# Patient Record
Sex: Female | Born: 1960
Health system: Southern US, Community
[De-identification: ages and names within clinical notes are randomized; demographics above are authoritative.]

## PROBLEM LIST (undated history)

## (undated) DIAGNOSIS — I10 Essential (primary) hypertension: Secondary | ICD-10-CM

## (undated) DIAGNOSIS — M1990 Unspecified osteoarthritis, unspecified site: Secondary | ICD-10-CM

## (undated) DIAGNOSIS — R5383 Other fatigue: Secondary | ICD-10-CM

## (undated) DIAGNOSIS — R251 Tremor, unspecified: Secondary | ICD-10-CM

## (undated) DIAGNOSIS — R238 Other skin changes: Secondary | ICD-10-CM

## (undated) DIAGNOSIS — F329 Major depressive disorder, single episode, unspecified: Secondary | ICD-10-CM

## (undated) DIAGNOSIS — IMO0001 Reserved for inherently not codable concepts without codable children: Secondary | ICD-10-CM

## (undated) DIAGNOSIS — R233 Spontaneous ecchymoses: Secondary | ICD-10-CM

## (undated) DIAGNOSIS — R262 Difficulty in walking, not elsewhere classified: Secondary | ICD-10-CM

## (undated) DIAGNOSIS — M549 Dorsalgia, unspecified: Secondary | ICD-10-CM

## (undated) DIAGNOSIS — R450 Nervousness: Secondary | ICD-10-CM

## (undated) DIAGNOSIS — M542 Cervicalgia: Secondary | ICD-10-CM

## (undated) DIAGNOSIS — F419 Anxiety disorder, unspecified: Secondary | ICD-10-CM

## (undated) DIAGNOSIS — F32A Depression, unspecified: Secondary | ICD-10-CM

## (undated) DIAGNOSIS — M2550 Pain in unspecified joint: Secondary | ICD-10-CM

## (undated) HISTORY — DX: Cervicalgia: M54.2

## (undated) HISTORY — DX: Spontaneous ecchymoses: R23.3

## (undated) HISTORY — DX: Depression, unspecified: F32.A

## (undated) HISTORY — DX: Other skin changes: R23.8

## (undated) HISTORY — DX: Dorsalgia, unspecified: M54.9

## (undated) HISTORY — DX: Reserved for inherently not codable concepts without codable children: IMO0001

## (undated) HISTORY — DX: Tremor, unspecified: R25.1

## (undated) HISTORY — DX: Nervousness: R45.0

## (undated) HISTORY — DX: Anxiety disorder, unspecified: F41.9

## (undated) HISTORY — DX: Essential (primary) hypertension: I10

## (undated) HISTORY — DX: Unspecified osteoarthritis, unspecified site: M19.90

## (undated) HISTORY — DX: Major depressive disorder, single episode, unspecified: F32.9

## (undated) HISTORY — PX: OVARIAN CYST SURGERY: SHX726

## (undated) HISTORY — DX: Pain in unspecified joint: M25.50

## (undated) HISTORY — DX: Difficulty in walking, not elsewhere classified: R26.2

## (undated) HISTORY — DX: Other fatigue: R53.83

---

## 1998-05-16 ENCOUNTER — Emergency Department (HOSPITAL_COMMUNITY): Admission: EM | Admit: 1998-05-16 | Discharge: 1998-05-16 | Payer: Self-pay | Admitting: Emergency Medicine

## 1999-07-19 ENCOUNTER — Other Ambulatory Visit: Admission: RE | Admit: 1999-07-19 | Discharge: 1999-07-19 | Payer: Self-pay | Admitting: Obstetrics and Gynecology

## 2005-07-04 ENCOUNTER — Other Ambulatory Visit: Admission: RE | Admit: 2005-07-04 | Discharge: 2005-07-04 | Payer: Self-pay | Admitting: *Deleted

## 2005-09-15 ENCOUNTER — Ambulatory Visit (HOSPITAL_COMMUNITY): Admission: RE | Admit: 2005-09-15 | Discharge: 2005-09-15 | Payer: Self-pay | Admitting: *Deleted

## 2005-09-15 ENCOUNTER — Encounter (INDEPENDENT_AMBULATORY_CARE_PROVIDER_SITE_OTHER): Payer: Self-pay | Admitting: Specialist

## 2009-07-22 ENCOUNTER — Ambulatory Visit (HOSPITAL_COMMUNITY): Admission: RE | Admit: 2009-07-22 | Discharge: 2009-07-22 | Payer: Self-pay | Admitting: Orthopedic Surgery

## 2011-01-27 NOTE — Op Note (Signed)
[name], Randolph               ACCOUNT NO.:  [id number]   MEDICAL RECORD NO.:  [id number]          PATIENT TYPE:  AMB   LOCATION:  SDC                           FACILITY:  [facility]   PHYSICIAN:  Almedia Balls. Fore, M.D.   DATE OF BIRTH:  09-09-1961   DATE OF PROCEDURE:  09/15/2005  DATE OF DISCHARGE:                                 OPERATIVE REPORT   PREOPERATIVE DIAGNOSES:  1.  Abnormal uterine bleeding.  2.  Pelvic pain.  3.  Persisting left ovarian cyst.   POSTOPERATIVE DIAGNOSES:  1.  Abnormal uterine bleeding.  2.  Pelvic pain.  3.  Persisting left ovarian cyst.  4.  Pending pathology.   OPERATION:  1.  Diagnostic hysteroscopy.  2.  Fractional dilatation and curettage.  3.  Laparoscopy with a left ovarian cystectomy.   ANESTHESIA:  General orotracheal.   OPERATOR:  Almedia Balls. Randell Patient, M.D.   INDICATIONS FOR SURGERY:  The patient is a 50 year old with the above-noted  problems.  She has been counseled as to the need for surgery to treat these  problems.  She was fully counseled as to the nature of the procedure and the  risks involved, including risks of anesthesia, injury to uterus, tubes,  ovaries, bowel, bladder, blood vessels, ureters, postoperative  hemorrhage,  infection,  and recuperation.  She fully understands all these  considerations and has signed informed consent to proceed on September 15, 2005.   OPERATIVE FINDINGS:  On hysteroscopy, the uterus was midposterior and  sounded to approximately 8 cm.  The endocervical canal was clean.  The  endometrial cavity had a small amount of shaggy-appearing tissue present.  On laparoscopy, the lower liver edge, gallbladder, spleen and area of the  appendix were normal to visualization.  In the pelvis, the uterus, right  tube and ovary appeared normal.  The left ovary was involved with a cystic  structure approximately 6 cm in greatest dimensions.   PROCEDURE:  With the patient under general anesthesia, prepared and draped  in the usual sterile fashion, a speculum was placed in the vagina.  The  anterior lip of the cervix was grasped with a single-tooth tenaculum, and a  solution of 1% lidocaine was injected at the 3, 9 and 12 o'clock positions  for a total of 10 mL for paracervical block.  A small sharp curette was used  for curettage of the endocervical canal.  A moderate amount of normal-  appearing tissue was obtained plus a polypoid appearing structure as well.  The uterus was sounded as noted above, the cervix dilated up through a#21  [facility] dilator. The diagnostic hysteroscope was introduced using free flow of  [place] and direct vision with the above-noted findings.  The hysteroscope  was removed, and a medium sharp curette and polyp forceps were used for  removal of tissue from the endometrial cavity.  When no further tissue was  obtained, the hysteroscope was re-employed to ensure that all tissue had  been removed and that hemostasis was maintained and that sponge and  instrument counts were correct.  When this  was noted to be the case, an  acorn cannula was placed on the cervix, and the patient was catheterized  with free flow of clear urine.   She was prepared and draped for a laparoscopic procedure.  An incision was  made in the lower pole of the umbilicus with insertion of the Veress cannula  and insufflation of 3 L of carbon dioxide.  The reusable 10 mm trocar for  the operative scope and the scope itself were then inserted into the  peritoneal cavity.  A reusable 5 mm probe was inserted through a stab wound  just above the symphysis pubis but well above the reflection of the bladder.  The above-noted findings were then visualized.  The left ovarian cyst wall  was rendered hemostatic for insertion of a suction aspirator to remove the  fluid, which was totally clear.  The cyst wall was rendered hemostatic with  bipolar electrocoagulation.  Sharp dissection was used to remove the cyst  from the  remaining normal-appearing ovarian tissue.  This was accomplished  without difficulty.  Incised edges were rendered hemostatic with bipolar  electrocoagulation.  The area was then lavaged with copious amounts of  lactated Ringer's solution, ad after noting hemostasis was maintained  following reduction of intra-abdominal pressure by allowing gas to escape  and that sponge and instrument counts were correct, the instruments were  removed from the peritoneal cavity.  Gas was allowed to fully escape, and  the incisions were closed with fascial sutures of 3-0 Vicryl and  subcuticular sutures of 3-0 plain catgut.  Estimated blood loss less than 25  mL.  The patient was taken to the recovery room in good condition following  catheterization and free flow of clear urine.   FOLLOW-UP CARE:  She is to return to the office in two weeks for follow-up  and was instructed to call if heavy bleeding, pain or unexplained fever  should ensue.  She was given prescriptions for Darvocet N-100, #20, to be  taken one-half to two q.6h. p.r.n. pain and Macrobid generic, #8, to be  taken two stat, one b.i.d.           ______________________________  Almedia Balls. Randell Patient, M.D.     SRF/MEDQ  D:  09/15/2005  T:  09/15/2005  Job:  [id number]

## 2012-06-19 ENCOUNTER — Other Ambulatory Visit (HOSPITAL_COMMUNITY): Payer: Self-pay | Admitting: Orthopedic Surgery

## 2012-06-19 DIAGNOSIS — M542 Cervicalgia: Secondary | ICD-10-CM

## 2012-06-21 ENCOUNTER — Ambulatory Visit (HOSPITAL_COMMUNITY)
Admission: RE | Admit: 2012-06-21 | Discharge: 2012-06-21 | Disposition: A | Discharge status: Home / Self Care | Payer: 59 | Source: Ambulatory Visit | Attending: Orthopedic Surgery | Admitting: Orthopedic Surgery

## 2012-06-21 DIAGNOSIS — M542 Cervicalgia: Secondary | ICD-10-CM

## 2012-06-21 DIAGNOSIS — M5020 Other cervical disc displacement, unspecified cervical region: Secondary | ICD-10-CM | POA: Insufficient documentation

## 2013-08-01 ENCOUNTER — Encounter: Payer: Self-pay | Admitting: Podiatry

## 2013-08-01 ENCOUNTER — Ambulatory Visit (INDEPENDENT_AMBULATORY_CARE_PROVIDER_SITE_OTHER): Payer: 59 | Admitting: Podiatry

## 2013-08-01 ENCOUNTER — Ambulatory Visit (INDEPENDENT_AMBULATORY_CARE_PROVIDER_SITE_OTHER): Payer: 59

## 2013-08-01 ENCOUNTER — Ambulatory Visit: Payer: Self-pay | Admitting: Podiatry

## 2013-08-01 VITALS — BP 90/75 | HR 68 | Resp 16 | Ht 64.0 in | Wt 210.0 lb

## 2013-08-01 DIAGNOSIS — B351 Tinea unguium: Secondary | ICD-10-CM

## 2013-08-01 DIAGNOSIS — M79609 Pain in unspecified limb: Secondary | ICD-10-CM

## 2013-08-01 DIAGNOSIS — L259 Unspecified contact dermatitis, unspecified cause: Secondary | ICD-10-CM

## 2013-08-01 DIAGNOSIS — M79671 Pain in right foot: Secondary | ICD-10-CM

## 2013-08-01 DIAGNOSIS — M779 Enthesopathy, unspecified: Secondary | ICD-10-CM

## 2013-08-01 MED ORDER — TRIAMCINOLONE ACETONIDE 10 MG/ML IJ SUSP
5.0000 mg | Freq: Once | INTRAMUSCULAR | Status: AC
Start: 2013-08-01 — End: 2013-08-01
  Administered 2013-08-01: 5 mg via INTRA_ARTICULAR

## 2013-08-01 NOTE — Progress Notes (Signed)
  Subjective:    Patient ID: Melody Randolph, female    DOB: [birth year], 52 y.o.   MRN: [id number]  HPI Comments: N PAIN L RIGHT FOOT/ANKLE D 6 WEEKS O SUDDEN C BETTER A STANDING, WALKING T ICE, ELEVATE, ALEVE, IBUPROFEN, ACE WRAP, BRACE   Foot Pain Associated symptoms include fatigue.      Review of Systems  Constitutional: Positive for fatigue.       WEIGHT CHANGE SWEATING   HENT: Negative.   Eyes: Negative.   Respiratory: Negative.   Cardiovascular: Negative.   Gastrointestinal: Negative.   Endocrine: Negative.   Genitourinary: Negative.   Musculoskeletal: Positive for back pain.       JOINT PAIN DIFFICULTY WALKING  Skin: Negative.   Allergic/Immunologic: Negative.   Neurological: Positive for tremors.  Hematological: Bruises/bleeds easily.  Psychiatric/Behavioral:       NERVOUS       Objective:   Physical Exam        Assessment & Plan:

## 2013-08-01 NOTE — Progress Notes (Signed)
Subjective:     Patient ID: Melody Randolph, female   DOB: 25-Jun-1961, 52 y.o.   MRN: [id number]  Foot Pain   patient presents stating my right ankle has been hurting me for about 6 weeks since I changed shoe gear try to reduce activity and use ice without relief of symptoms   Review of Systems  All other systems reviewed and are negative.       Objective:   Physical Exam  Nursing note and vitals reviewed. Constitutional: She is oriented to person, place, and time.  Cardiovascular: Intact distal pulses.   Musculoskeletal: Normal range of motion.  Neurological: She is oriented to person, place, and time.  Skin: Skin is warm.   neurovascular status intact with discomfort in the sinus tarsi right and slightly into the lateral foot but nowhere near as intense as the ankle itself muscle strength adequate with no equinus condition noted     Assessment:     Sinus tarsitis right with inflammation of the capsule    Plan:     H&P and x-ray reviewed of left foot ankle. Injected the right sinus tarsi 3 movements Kenalog 5 mg Xylocaine Marcaine mixture and advised on ankle brace or possible boot immobilization if symptoms do not get better over the next couple weeks. Reappoint as needed

## 2015-09-23 DIAGNOSIS — F321 Major depressive disorder, single episode, moderate: Secondary | ICD-10-CM | POA: Diagnosis not present

## 2015-10-15 MED FILL — ESCITALOPRAM 10 MG TABLET: 10 | 30 days supply | Qty: 30 | Fill #0

## 2015-10-20 DIAGNOSIS — C44319 Basal cell carcinoma of skin of other parts of face: Secondary | ICD-10-CM | POA: Diagnosis not present

## 2015-10-20 DIAGNOSIS — D225 Melanocytic nevi of trunk: Secondary | ICD-10-CM | POA: Diagnosis not present

## 2015-10-20 DIAGNOSIS — C44519 Basal cell carcinoma of skin of other part of trunk: Secondary | ICD-10-CM | POA: Diagnosis not present

## 2015-10-20 DIAGNOSIS — D1801 Hemangioma of skin and subcutaneous tissue: Secondary | ICD-10-CM | POA: Diagnosis not present

## 2015-10-20 DIAGNOSIS — C44310 Basal cell carcinoma of skin of unspecified parts of face: Secondary | ICD-10-CM | POA: Diagnosis not present

## 2015-10-20 DIAGNOSIS — L821 Other seborrheic keratosis: Secondary | ICD-10-CM | POA: Diagnosis not present

## 2015-11-17 DIAGNOSIS — Z85828 Personal history of other malignant neoplasm of skin: Secondary | ICD-10-CM | POA: Diagnosis not present

## 2015-11-17 DIAGNOSIS — Z08 Encounter for follow-up examination after completed treatment for malignant neoplasm: Secondary | ICD-10-CM | POA: Diagnosis not present

## 2015-11-22 MED FILL — ESCITALOPRAM 10 MG TABLET: 10 | 30 days supply | Qty: 30 | Fill #1

## 2015-12-31 MED FILL — ESCITALOPRAM 10 MG TABLET: 10 | 30 days supply | Qty: 30 | Fill #1 | Status: TO

## 2016-01-07 ENCOUNTER — Ambulatory Visit (HOSPITAL_COMMUNITY)
Admission: EM | Admit: 2016-01-07 | Discharge: 2016-01-07 | Disposition: A | Discharge status: Home / Self Care | Payer: 59 | Attending: Family Medicine | Admitting: Family Medicine

## 2016-01-07 ENCOUNTER — Encounter (HOSPITAL_COMMUNITY): Payer: Self-pay

## 2016-01-07 DIAGNOSIS — J302 Other seasonal allergic rhinitis: Secondary | ICD-10-CM | POA: Diagnosis not present

## 2016-01-07 DIAGNOSIS — R058 Other specified cough: Secondary | ICD-10-CM

## 2016-01-07 DIAGNOSIS — R05 Cough: Secondary | ICD-10-CM

## 2016-01-07 MED ORDER — GUAIFENESIN ER 600 MG PO TB12: 1200.0000 mg | ORAL_TABLET | Freq: Two times a day (BID) | ORAL | Status: DC

## 2016-01-07 MED ORDER — PREDNISONE 10 MG PO TABS: 40.0000 mg | ORAL_TABLET | Freq: Every day | ORAL | Status: DC

## 2016-01-07 MED ORDER — CETIRIZINE HCL 10 MG PO TABS: 10.0000 mg | ORAL_TABLET | Freq: Every day | ORAL | Status: AC

## 2016-01-07 MED ORDER — ALBUTEROL SULFATE HFA 108 (90 BASE) MCG/ACT IN AERS: 2.0000 | INHALATION_SPRAY | RESPIRATORY_TRACT | Status: DC | PRN

## 2016-01-07 NOTE — ED Notes (Signed)
Patient states she may have bronchitis she has fluid in her chest possible URI No acute distress

## 2016-01-07 NOTE — Discharge Instructions (Signed)
It is a pleasure to see you today.  I believe there is a significant allergy component to your chest congestion and cough.   I recommend CETIRIZINE 10mg  tablets, take 1 tablet by mouth once daily in the morning.   For the chest congestion, MUCINEX 600mg  tablets, take 2 tablets by mouth every 12 hours as needed to break up mucus.    You may use ALBUTEROL inhaler, 2 puffs every 4-6 hours as needed.   If you are not improving with this regimen, I believe it is reasonable to fill the prescription for PREDNISONE 20mg , take 2 tablets (40mg ) by mouth once daily for 5 days.    Follow up with your primary doctor in the [place] or return to [facility] if not improved, if new problems arise.

## 2016-01-07 NOTE — ED Provider Notes (Signed)
CSN: [id number]     Arrival date & time 01/07/16  1541 History   First MD Initiated Contact with Patient 01/07/16 1800     Chief Complaint  Patient presents with  . Cough   (Consider location/radiation/quality/duration/timing/severity/associated sxs/prior Treatment) Patient is a 55 y.o. female presenting with cough. The history is provided by the patient. No language interpreter was used.  Cough Associated symptoms: eye discharge, fever and rhinorrhea   Associated symptoms: no chest pain, no chills, no diaphoresis, no shortness of breath, no sore throat and no wheezing   Patient presents with complaint of chest congestion and cough productive of white/clear sputum since April 23/24th.  She mowed the grass on 04/22 and soon thereafter had worsening of cough and congestion. Suffers with springtime seasonal allergies, does not take any medicines for this.  Predominant sxs are nasal (rhinorrhea) and ocular (tearing, coryza).   Has been using lemon and honey, to try and break up phlegm.   No measured fevers, did feel "warm" a couple times during the week. No chills or sweats.   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   Social Hx; Works as Teaching laboratory technician.  Former smoker quit over 20 years ago.      Past Medical History  Diagnosis Date  . Sweating   . Fatigue   . Joint pain   . Back pain   . Difficulty walking   . Tremors of nervous system   . Bruises easily   . Nervous   . Anxiety   . Depression   . HBP (high blood pressure)   . Arthritis   . Neck pain    Past Surgical History  Procedure Laterality Date  . Ovarian cyst surgery     No family history on file. Social History  Substance Use Topics  . Smoking status: Current Some Day Smoker    Types: Cigarettes  . Smokeless tobacco: Never Used  . Alcohol Use: Yes     Comment: WEEKENDS   OB History    No data available     Review of Systems  Constitutional: Positive for fever. Negative for chills, diaphoresis, appetite  change and fatigue.  HENT: Positive for congestion, postnasal drip and rhinorrhea. Negative for ear discharge, sinus pressure, sore throat and trouble swallowing.   Eyes: Positive for discharge and itching. Negative for photophobia and pain.  Respiratory: Positive for cough. Negative for choking, shortness of breath, wheezing and stridor.   Cardiovascular: Negative for chest pain and palpitations.  Gastrointestinal: Negative for nausea, vomiting, abdominal pain and diarrhea.    Allergies  Review of patient's allergies indicates no known allergies.  Home Medications   Prior to Admission medications   Medication Sig Start Date End Date Taking? Authorizing Provider  albuterol (PROVENTIL HFA;VENTOLIN HFA) 108 (90 Base) MCG/ACT inhaler Inhale 2 puffs into the lungs every 4 (four) hours as needed for wheezing or shortness of breath. 01/07/16   Willeen Niece, MD  cetirizine (ZYRTEC) 10 MG tablet Take 1 tablet (10 mg total) by mouth daily. 01/07/16   Willeen Niece, MD  guaiFENesin (MUCINEX) 600 MG 12 hr tablet Take 2 tablets (1,200 mg total) by mouth 2 (two) times daily. 01/07/16   Willeen Niece, MD  predniSONE (DELTASONE) 10 MG tablet Take 4 tablets (40 mg total) by mouth daily with breakfast. 01/07/16   Willeen Niece, MD  ValACYclovir HCl (VALTREX PO) Take 500 mg by mouth as needed.    Historical Provider, MD   Meds Ordered  and Administered this Visit  Medications - No data to display  BP 113/75 mmHg  Pulse 63  Temp(Src) 98.4 F (36.9 C) (Oral)  Resp 16  SpO2 100% No data found.   Physical Exam  Constitutional: She appears well-developed and well-nourished. No distress.  HENT:  Head: Normocephalic and atraumatic.  Right Ear: External ear normal.  Left Ear: External ear normal.  Mouth/Throat: Oropharynx is clear and moist. No oropharyngeal exudate.  Boggy nasal mucosa with clear rhinorrhea  Eyes: Conjunctivae and EOM are normal. Pupils are equal, round, and reactive to light. No scleral  icterus.  Mild injected sclerae with tearing bilaterally  Neck: Normal range of motion. Neck supple. No thyromegaly present.  Cardiovascular: Normal rate, regular rhythm and normal heart sounds.   Pulmonary/Chest: Effort normal. No respiratory distress. She has no wheezes. She has no rales. She exhibits no tenderness.  Scattered rhonchi bilaterally on lung exam. No rales or wheezes noted. Good air movement noted.   Lymphadenopathy:    She has no cervical adenopathy.  Skin: She is not diaphoretic.    ED Course  Procedures (including critical care time)  Labs Review Labs Reviewed - No data to display  Imaging Review No results found.   Visual Acuity Review  Right Eye Distance:   Left Eye Distance:   Bilateral Distance:    Right Eye Near:   Left Eye Near:    Bilateral Near:         MDM   1. Other seasonal allergic rhinitis   2. Allergic cough    Patient with coughing and chest congestion likely secondary to allergic etiology.  Also possible viral component given large number sick contacts at work.   Daily Zyrtec 10mg  daily.  May use Albuterol prn for cough.  Twice-daily mucolytic and increased fluids.   If not improving (or worsening) with this regimen, may use short-burst oral prednisone, discussed with patient.  For follow up in [facility] or [place] primary doctor as needed if not improving.  Dalbert Mayotte, MD    Willeen Niece, MD 01/07/16 [phone]

## 2016-01-14 MED FILL — VENTOLIN HFA 90 MCG INHALER: 108 (90 BAS | 16 days supply | Qty: 18 | Fill #0

## 2016-01-14 MED FILL — predniSONE 10 MG TABS: 10 | 3 days supply | Qty: 10 | Fill #0

## 2016-01-27 ENCOUNTER — Other Ambulatory Visit: Payer: Self-pay | Admitting: Occupational Medicine

## 2016-01-27 ENCOUNTER — Ambulatory Visit: Payer: Self-pay

## 2016-01-27 DIAGNOSIS — M549 Dorsalgia, unspecified: Secondary | ICD-10-CM

## 2016-01-27 DIAGNOSIS — M25561 Pain in right knee: Secondary | ICD-10-CM

## 2016-02-03 MED FILL — ESCITALOPRAM 10 MG TABLET: 10 | 30 days supply | Qty: 30 | Fill #0

## 2016-04-17 MED FILL — ESCITALOPRAM 10 MG TABLET: 10 | 30 days supply | Qty: 30 | Fill #1

## 2016-05-23 MED FILL — ESCITALOPRAM 10 MG TABLET: 10 | 30 days supply | Qty: 30 | Fill #2

## 2016-06-01 DIAGNOSIS — Z1211 Encounter for screening for malignant neoplasm of colon: Secondary | ICD-10-CM | POA: Diagnosis not present

## 2016-06-01 DIAGNOSIS — Z809 Family history of malignant neoplasm, unspecified: Secondary | ICD-10-CM | POA: Diagnosis not present

## 2016-07-07 MED FILL — ESCITALOPRAM 10 MG TABLET: 10 | 30 days supply | Qty: 30 | Fill #3

## 2016-07-12 DIAGNOSIS — Z01818 Encounter for other preprocedural examination: Secondary | ICD-10-CM | POA: Diagnosis not present

## 2016-07-24 DIAGNOSIS — H02834 Dermatochalasis of left upper eyelid: Secondary | ICD-10-CM | POA: Diagnosis not present

## 2016-07-24 DIAGNOSIS — H53483 Generalized contraction of visual field, bilateral: Secondary | ICD-10-CM | POA: Diagnosis not present

## 2016-07-24 DIAGNOSIS — H02831 Dermatochalasis of right upper eyelid: Secondary | ICD-10-CM | POA: Diagnosis not present

## 2016-07-24 DIAGNOSIS — H02413 Mechanical ptosis of bilateral eyelids: Secondary | ICD-10-CM | POA: Diagnosis not present

## 2016-10-10 DIAGNOSIS — Z791 Long term (current) use of non-steroidal anti-inflammatories (NSAID): Secondary | ICD-10-CM | POA: Diagnosis not present

## 2016-10-10 DIAGNOSIS — Z1211 Encounter for screening for malignant neoplasm of colon: Secondary | ICD-10-CM | POA: Diagnosis not present

## 2016-10-10 DIAGNOSIS — K529 Noninfective gastroenteritis and colitis, unspecified: Secondary | ICD-10-CM | POA: Diagnosis not present

## 2016-10-10 DIAGNOSIS — K5730 Diverticulosis of large intestine without perforation or abscess without bleeding: Secondary | ICD-10-CM | POA: Diagnosis not present

## 2017-01-31 DIAGNOSIS — S80911A Unspecified superficial injury of right knee, initial encounter: Secondary | ICD-10-CM | POA: Diagnosis not present

## 2017-01-31 DIAGNOSIS — W1849XA Other slipping, tripping and stumbling without falling, initial encounter: Secondary | ICD-10-CM | POA: Diagnosis not present

## 2017-01-31 DIAGNOSIS — I10 Essential (primary) hypertension: Secondary | ICD-10-CM | POA: Diagnosis not present

## 2017-01-31 DIAGNOSIS — W010XXA Fall on same level from slipping, tripping and stumbling without subsequent striking against object, initial encounter: Secondary | ICD-10-CM | POA: Diagnosis not present

## 2017-01-31 DIAGNOSIS — S81011A Laceration without foreign body, right knee, initial encounter: Secondary | ICD-10-CM | POA: Diagnosis not present

## 2017-01-31 DIAGNOSIS — S8991XA Unspecified injury of right lower leg, initial encounter: Secondary | ICD-10-CM | POA: Diagnosis not present

## 2017-01-31 DIAGNOSIS — M25561 Pain in right knee: Secondary | ICD-10-CM | POA: Diagnosis not present

## 2017-02-01 IMAGING — CR DG LUMBAR SPINE COMPLETE 4+V
5 series · 5 of 5 positions shown · non-contrast
Comparison: No prior .

CLINICAL DATA: Injury.  Pain with radiation down right leg.

EXAM:
LUMBAR SPINE - COMPLETE 4+ VIEW

[view not recorded (1 of 5)]
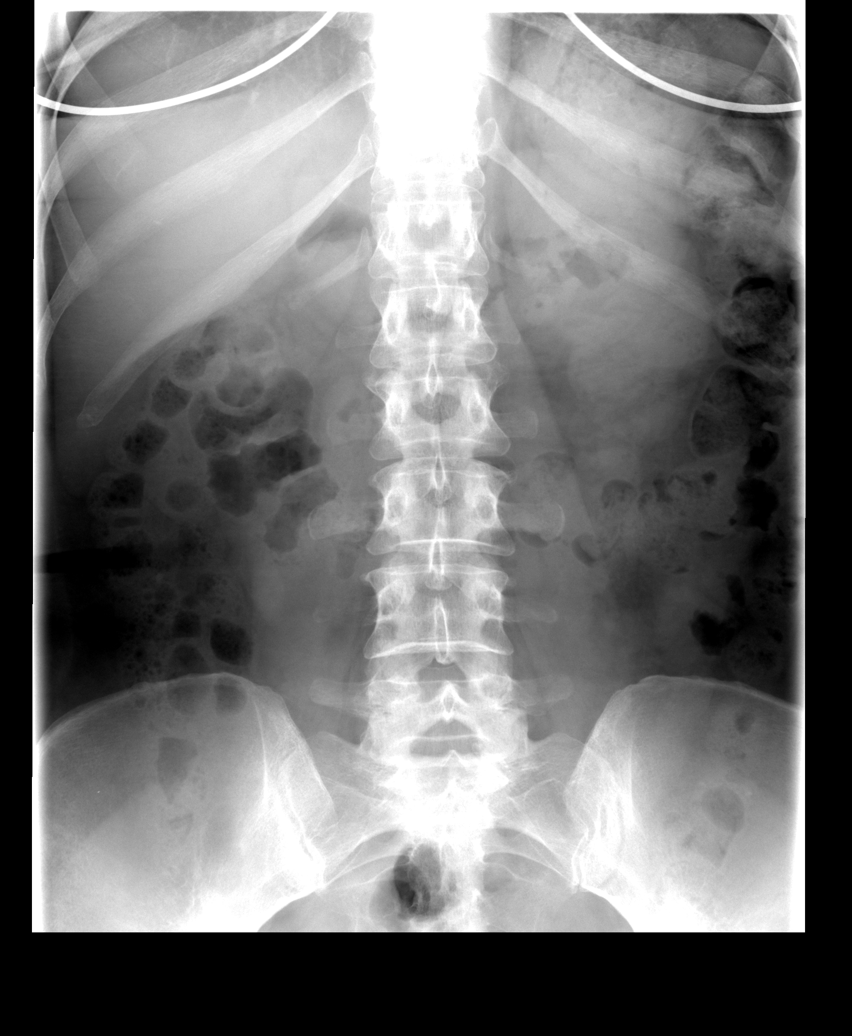

[view not recorded (2 of 5)]
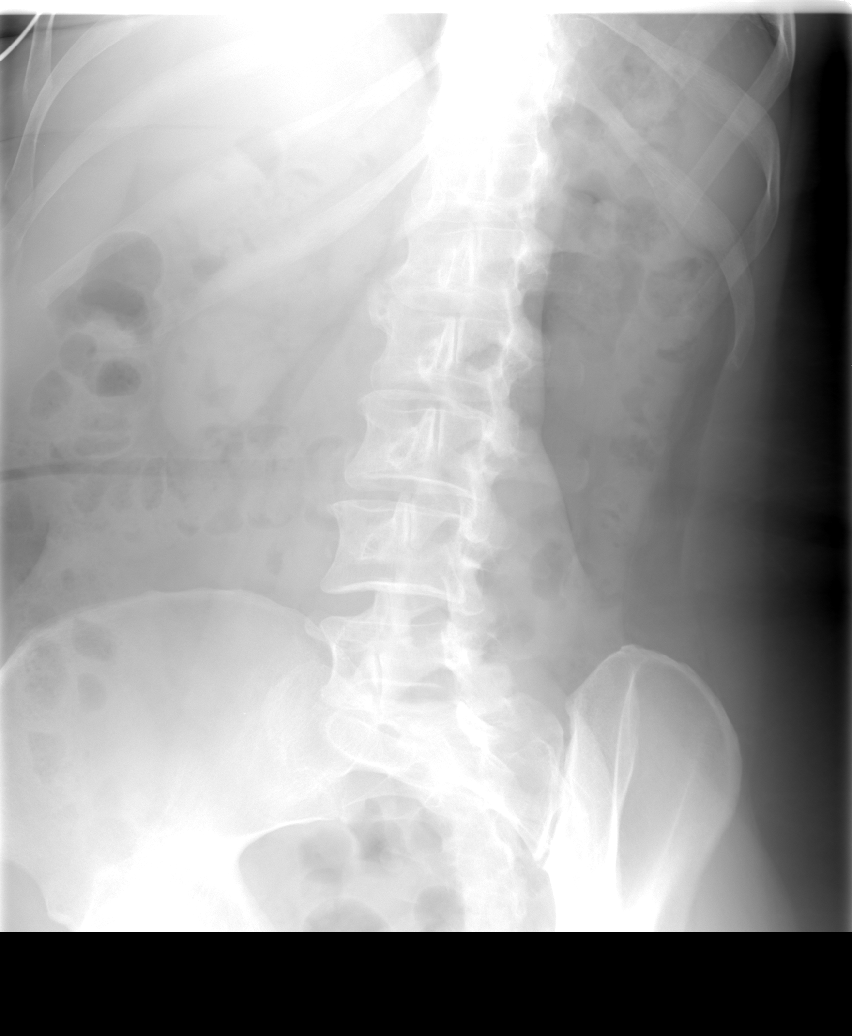

[view not recorded (3 of 5)]
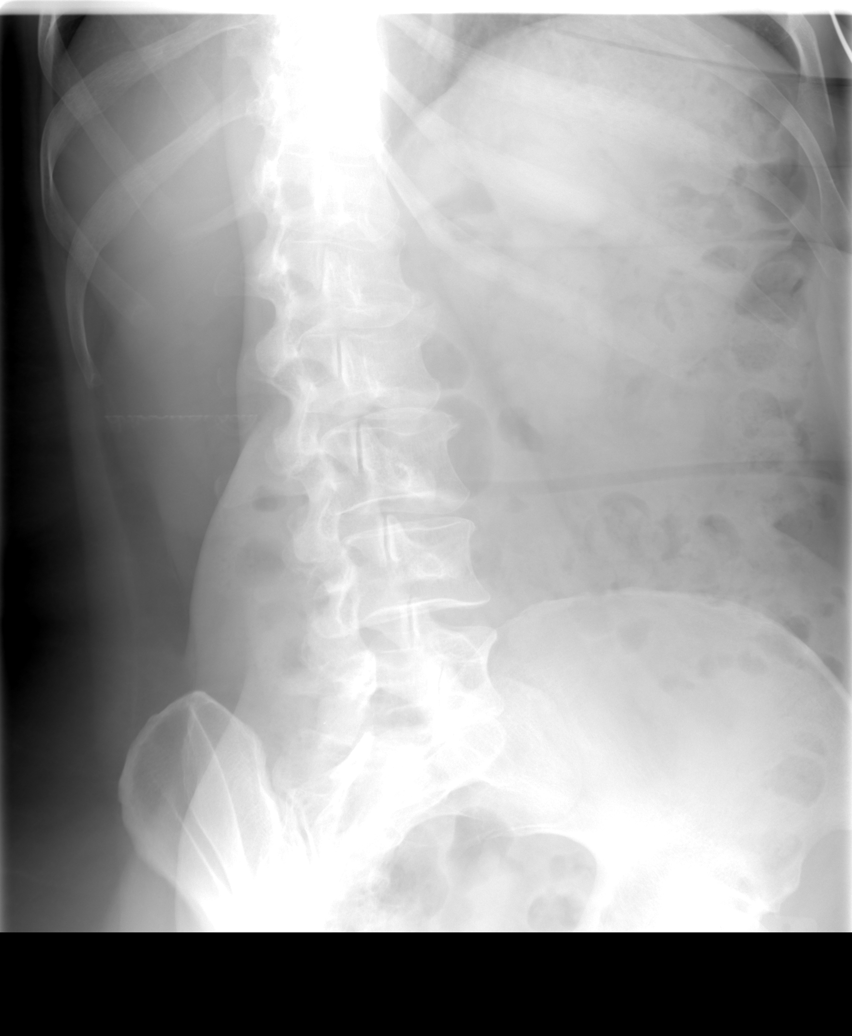

[view not recorded (4 of 5)]
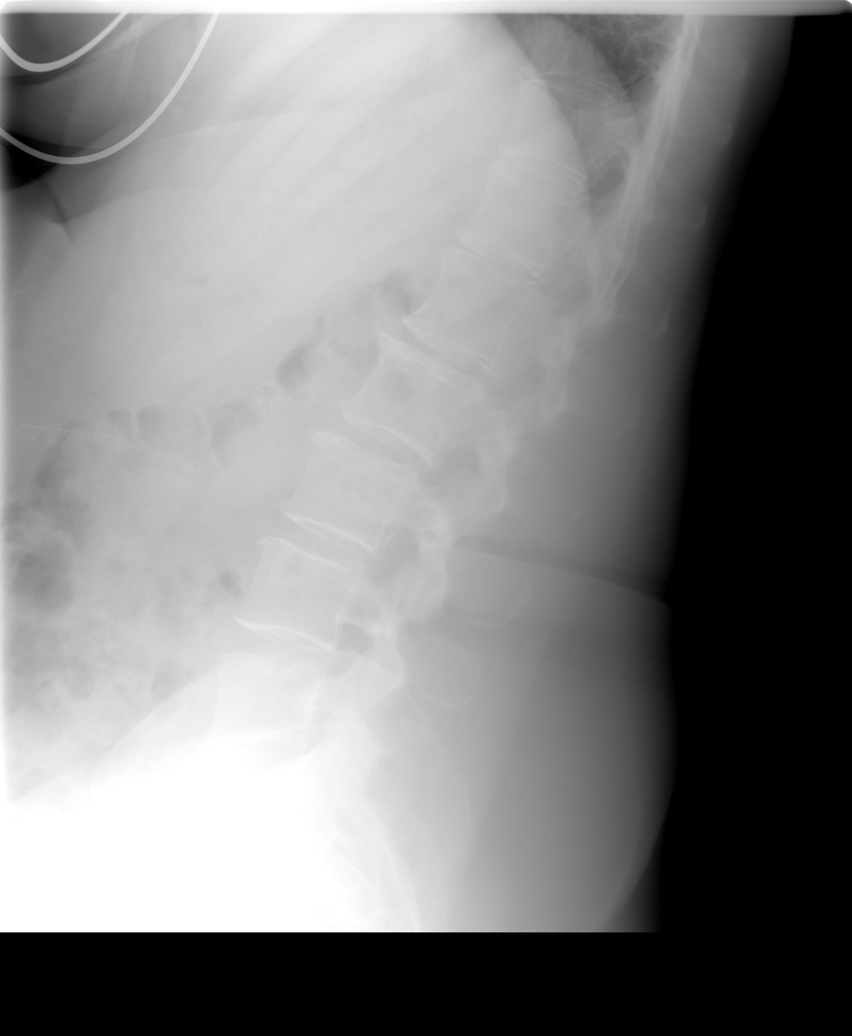

[view not recorded (5 of 5)]
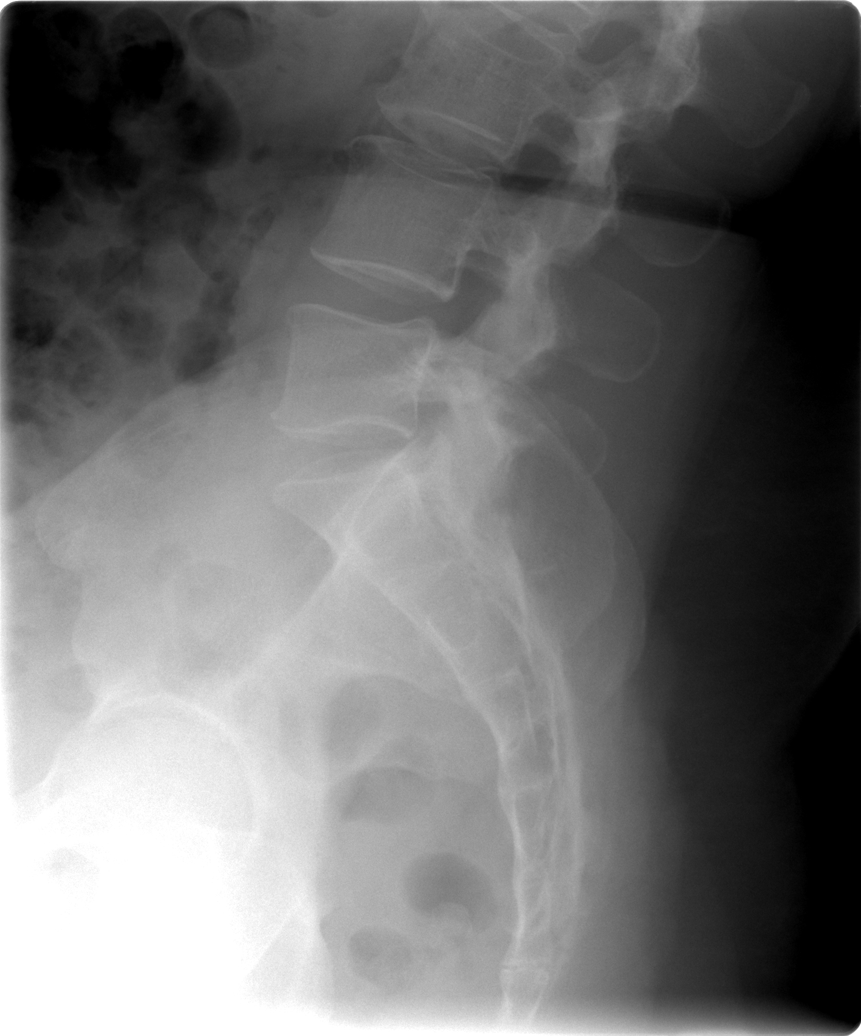

[5 of 5 positions shown; findings below may reference images not displayed]

FINDINGS: No acute bony abnormality identified. Normal alignment and
mineralization.
IMPRESSION: No acute or focal abnormality.

## 2017-02-06 ENCOUNTER — Encounter (HOSPITAL_COMMUNITY): Payer: Self-pay | Admitting: Family Medicine

## 2017-02-06 ENCOUNTER — Ambulatory Visit (HOSPITAL_COMMUNITY)
Admission: EM | Admit: 2017-02-06 | Discharge: 2017-02-06 | Disposition: A | Discharge status: Home / Self Care | Payer: 59 | Attending: Family Medicine | Admitting: Family Medicine

## 2017-02-06 DIAGNOSIS — S81011A Laceration without foreign body, right knee, initial encounter: Secondary | ICD-10-CM | POA: Diagnosis not present

## 2017-02-06 DIAGNOSIS — R1030 Lower abdominal pain, unspecified: Secondary | ICD-10-CM

## 2017-02-06 NOTE — Discharge Instructions (Signed)
I don't see a problem with you're going back to work today.  He should come back in a week for suture removal at which time he should not have to pay because suture removal is a follow-up visit.

## 2017-02-06 NOTE — ED Provider Notes (Signed)
[facility]    CSN: [id number] Arrival date & time: 02/06/17  1001     History   Chief Complaint Chief Complaint  Patient presents with  . Follow-up    HPI Melody Randolph is a 56 y.o. female.   This a 56 year old woman comes into the [facility] urgent care center for evaluation of a knee injury. She cut her right knee in [place] on 01/31/2017 and had a 2 layer closure. She is able to bend the knee fine now although she does wear a brace. She comes in asking if she can go back to work.      Past Medical History:  Diagnosis Date  . Anxiety   . Arthritis   . Back pain   . Bruises easily   . Depression   . Difficulty walking   . Fatigue   . HBP (high blood pressure)   . Joint pain   . Neck pain   . Nervous   . Sweating   . Tremors of nervous system     There are no active problems to display for this patient.   Past Surgical History:  Procedure Laterality Date  . OVARIAN CYST SURGERY      OB History    No data available       Home Medications    Prior to Admission medications   Medication Sig Start Date End Date Taking? Authorizing Provider  albuterol (PROVENTIL HFA;VENTOLIN HFA) 108 (90 Base) MCG/ACT inhaler Inhale 2 puffs into the lungs every 4 (four) hours as needed for wheezing or shortness of breath. 01/07/16   Willeen Niece, MD  cetirizine (ZYRTEC) 10 MG tablet Take 1 tablet (10 mg total) by mouth daily. 01/07/16   Willeen Niece, MD  guaiFENesin (MUCINEX) 600 MG 12 hr tablet Take 2 tablets (1,200 mg total) by mouth 2 (two) times daily. 01/07/16   Willeen Niece, MD  predniSONE (DELTASONE) 10 MG tablet Take 4 tablets (40 mg total) by mouth daily with breakfast. 01/07/16   Willeen Niece, MD  ValACYclovir HCl (VALTREX PO) Take 500 mg by mouth as needed.    [provider]    Family History No family history on file.  Social History Social History  Substance Use Topics  . Smoking status: Current Some  Day Smoker    Types: Cigarettes  . Smokeless tobacco: Never Used  . Alcohol use Yes     Comment: WEEKENDS     Allergies   Patient has no known allergies.   Review of Systems Review of Systems  Constitutional: Negative.   Skin: Positive for wound.     Physical Exam Triage Vital Signs ED Triage Vitals  Enc Vitals Group     BP      Pulse      Resp      Temp      Temp src      SpO2      Weight      Height      Head Circumference      Peak Flow      Pain Score      Pain Loc      Pain Edu?      Excl. in [place]?    No data found.   Updated Vital Signs There were no vitals taken for this visit.   Physical Exam  Constitutional: She is oriented to person, place, and time. She appears well-developed and well-nourished.  Pulmonary/Chest: Effort normal.  Musculoskeletal: Normal range of motion. She exhibits no tenderness.  Neurological: She is alert and oriented to person, place, and time.  Skin: Skin is warm and dry. No erythema.  Nursing note and vitals reviewed.  Patient is able to bend her knee freely. The wound appears to be well approximated without swelling or erythema.  UC Treatments / Results  Labs (all labs ordered are listed, but only abnormal results are displayed) Labs Reviewed - No data to display  EKG  EKG Interpretation None       Radiology No results found.  Procedures Procedures (including critical care time)  Medications Ordered in UC Medications - No data to display   Initial Impression / Assessment and Plan / UC Course  I have reviewed the triage vital signs and the nursing notes.  Pertinent labs & imaging results that were available during my care of the patient were reviewed by me and considered in my medical decision making (see chart for details).     Final Clinical Impressions(s) / UC Diagnoses   Final diagnoses:  Knee laceration, right, initial encounter    New Prescriptions New Prescriptions   No medications on  file     Robyn Haber, MD 02/06/17 1034

## 2017-02-06 NOTE — ED Triage Notes (Signed)
Pt     Reports   She   Was   Seen    In     [place]      6   Days     For  r  Knee   Injury    Appears  Well  Healing   On  Anti  Biotics         Has  Brace  On   The   Leg      Needs   followup  And  Work  Release

## 2017-02-06 NOTE — ED Notes (Signed)
Patient's wound dressed with bacitracin and nonadherent gauze and wrapped with gauze wrap and ace bandage per provider's order. Patient placed her own knee immobilizer back in place.

## 2017-03-19 ENCOUNTER — Ambulatory Visit (INDEPENDENT_AMBULATORY_CARE_PROVIDER_SITE_OTHER): Payer: 59 | Admitting: Podiatry

## 2017-03-19 ENCOUNTER — Encounter: Payer: Self-pay | Admitting: Podiatry

## 2017-03-19 ENCOUNTER — Ambulatory Visit (INDEPENDENT_AMBULATORY_CARE_PROVIDER_SITE_OTHER): Payer: 59

## 2017-03-19 VITALS — BP 139/91

## 2017-03-19 DIAGNOSIS — M25571 Pain in right ankle and joints of right foot: Secondary | ICD-10-CM | POA: Diagnosis not present

## 2017-03-19 DIAGNOSIS — M778 Other enthesopathies, not elsewhere classified: Secondary | ICD-10-CM

## 2017-03-19 DIAGNOSIS — M7751 Other enthesopathy of right foot: Secondary | ICD-10-CM

## 2017-03-19 DIAGNOSIS — M779 Enthesopathy, unspecified: Secondary | ICD-10-CM

## 2017-03-19 MED ORDER — DICLOFENAC SODIUM 75 MG PO TBEC: 75.0000 mg | DELAYED_RELEASE_TABLET | Freq: Two times a day (BID) | ORAL | 2 refills | Status: DC

## 2017-03-19 MED ORDER — TRIAMCINOLONE ACETONIDE 10 MG/ML IJ SUSP
10.0000 mg | Freq: Once | INTRAMUSCULAR | Status: AC
  Administered 2017-03-19: 10 mg

## 2017-03-19 NOTE — Progress Notes (Signed)
   Subjective:    Patient ID: Melody Randolph, female    DOB: 11-May-1961, 56 y.o.   MRN: [id number]  HPI  Chief Complaint  Patient presents with  . Foot Pain    Rt foot ankle is painful     Review of Systems     Objective:   Physical Exam        Assessment & Plan:

## 2017-03-20 NOTE — Progress Notes (Signed)
Subjective:    Patient ID: Melody Randolph, female   DOB: 56 y.o.   MRN: [id number]   HPI patient states the right ankle has been real sore and it was better for a period of time that has become sore recently    Review of Systems  All other systems reviewed and are negative.       Objective:  Physical Exam  Cardiovascular: Intact distal pulses.   Musculoskeletal: Normal range of motion.  Neurological: She is alert.  Skin: Skin is warm.  Nursing note and vitals reviewed.  neurovascular status intact muscle strength adequate range of motion within normal limits with patient found to have exquisite discomfort sinus tarsi right into the lateral ankle gutter with good inversion eversion with slight splinting on the right side due to pain. Patient's found have good digital perfusion and is well oriented 3     Assessment:  Inflammatory capsulitis of the sinus tarsi subtalar joint right with lateral pain       Plan:  H&P x-ray reviewed and today I injected the sinus tarsi right 3 mg Kenalog 5 mill grams Xylocaine and into the lateral ankle gutter and applied fascial brace to support the foot. Reappoint to recheck again in the next few weeks  X-rays indicate the patient has no indications of advanced arthritis or stress fracture

## 2017-04-11 ENCOUNTER — Ambulatory Visit (INDEPENDENT_AMBULATORY_CARE_PROVIDER_SITE_OTHER): Payer: 59 | Admitting: Podiatry

## 2017-04-11 DIAGNOSIS — M779 Enthesopathy, unspecified: Secondary | ICD-10-CM | POA: Diagnosis not present

## 2017-04-12 NOTE — Progress Notes (Signed)
Subjective:    Patient ID: Melody Randolph, female   DOB: 56 y.o.   MRN: [id number]   HPI patient states the right ankle ankle is feeling a lot better with minimal discomfort    ROS      Objective:  Physical Exam neurovascular status intact with reduced inflammation of the right sinus tarsi with fluid still noted but quite a bit improved from previous visit     Assessment:     Sinus tarsitis right with inflammatory changes that's quite improved     Plan:    H&P condition reviewed and recommended physical therapy anti-inflammatories supportive shoe for particular condition. Patient will be seen back for [place] to recheck but at this time appears to be stable and will be seen back as needed

## 2017-05-16 DIAGNOSIS — Z1231 Encounter for screening mammogram for malignant neoplasm of breast: Secondary | ICD-10-CM | POA: Diagnosis not present

## 2018-04-12 DIAGNOSIS — K219 Gastro-esophageal reflux disease without esophagitis: Secondary | ICD-10-CM | POA: Diagnosis not present

## 2018-04-12 DIAGNOSIS — Z01411 Encounter for gynecological examination (general) (routine) with abnormal findings: Secondary | ICD-10-CM | POA: Diagnosis not present

## 2018-04-12 DIAGNOSIS — L304 Erythema intertrigo: Secondary | ICD-10-CM | POA: Diagnosis not present

## 2018-04-12 DIAGNOSIS — R05 Cough: Secondary | ICD-10-CM | POA: Diagnosis not present

## 2018-04-23 DIAGNOSIS — Z01411 Encounter for gynecological examination (general) (routine) with abnormal findings: Secondary | ICD-10-CM | POA: Diagnosis not present

## 2018-05-29 DIAGNOSIS — Z124 Encounter for screening for malignant neoplasm of cervix: Secondary | ICD-10-CM | POA: Diagnosis not present

## 2018-05-29 DIAGNOSIS — Z1231 Encounter for screening mammogram for malignant neoplasm of breast: Secondary | ICD-10-CM | POA: Diagnosis not present

## 2018-05-29 DIAGNOSIS — B373 Candidiasis of vulva and vagina: Secondary | ICD-10-CM | POA: Diagnosis not present

## 2018-08-19 DIAGNOSIS — H9319 Tinnitus, unspecified ear: Secondary | ICD-10-CM | POA: Diagnosis not present

## 2018-08-19 DIAGNOSIS — H9190 Unspecified hearing loss, unspecified ear: Secondary | ICD-10-CM | POA: Diagnosis not present

## 2018-08-19 DIAGNOSIS — L304 Erythema intertrigo: Secondary | ICD-10-CM | POA: Diagnosis not present

## 2018-08-19 DIAGNOSIS — F339 Major depressive disorder, recurrent, unspecified: Secondary | ICD-10-CM | POA: Diagnosis not present

## 2019-08-13 DIAGNOSIS — Z1231 Encounter for screening mammogram for malignant neoplasm of breast: Secondary | ICD-10-CM | POA: Diagnosis not present

## 2019-12-30 DIAGNOSIS — H903 Sensorineural hearing loss, bilateral: Secondary | ICD-10-CM | POA: Diagnosis not present

## 2019-12-30 DIAGNOSIS — H9313 Tinnitus, bilateral: Secondary | ICD-10-CM | POA: Diagnosis not present

## 2020-01-01 DIAGNOSIS — Z13220 Encounter for screening for lipoid disorders: Secondary | ICD-10-CM | POA: Diagnosis not present

## 2020-01-01 DIAGNOSIS — Z131 Encounter for screening for diabetes mellitus: Secondary | ICD-10-CM | POA: Diagnosis not present

## 2020-01-01 DIAGNOSIS — M545 Low back pain: Secondary | ICD-10-CM | POA: Diagnosis not present

## 2020-03-10 DIAGNOSIS — Z461 Encounter for fitting and adjustment of hearing aid: Secondary | ICD-10-CM | POA: Diagnosis not present

## 2020-03-10 DIAGNOSIS — H903 Sensorineural hearing loss, bilateral: Secondary | ICD-10-CM | POA: Diagnosis not present

## 2020-04-29 ENCOUNTER — Other Ambulatory Visit: Payer: Self-pay

## 2020-04-29 ENCOUNTER — Ambulatory Visit: Payer: 59 | Admitting: Podiatry

## 2020-04-29 ENCOUNTER — Encounter: Payer: Self-pay | Admitting: Podiatry

## 2020-04-29 ENCOUNTER — Ambulatory Visit (INDEPENDENT_AMBULATORY_CARE_PROVIDER_SITE_OTHER): Payer: 59

## 2020-04-29 DIAGNOSIS — M722 Plantar fascial fibromatosis: Secondary | ICD-10-CM

## 2020-04-29 MED ORDER — DICLOFENAC SODIUM 75 MG PO TBEC: 75.0000 mg | DELAYED_RELEASE_TABLET | Freq: Two times a day (BID) | ORAL | 2 refills | Status: DC

## 2020-04-29 MED FILL — DICLOFENAC SODIUM 75 MG TAB: 75 | 25 days supply | Qty: 50 | Fill #0

## 2020-04-29 NOTE — Patient Instructions (Signed)

## 2020-04-30 NOTE — Progress Notes (Signed)
Subjective:   Patient ID: Melody Randolph, female   DOB: 59 y.o.   MRN: [id number]   HPI Patient presents stating she has had several month history of pain in her left heel and states is been quite sore and making it hard for her to be active.  Patient has had history of foot pain but has not had this currently.  Patient is not currently smoking likes to be active   Review of Systems  All other systems reviewed and are negative.       Objective:  Physical Exam Vitals and nursing note reviewed.  Constitutional:      Appearance: She is well-developed.  Pulmonary:     Effort: Pulmonary effort is normal.  Musculoskeletal:        General: Normal range of motion.  Skin:    General: Skin is warm.  Neurological:     Mental Status: She is alert.     Neurovascular status intact muscle strength found to be adequate range of motion within normal limits.  Patient is noted to have exquisite discomfort in the left plantar fascia at the insertion of the tendon into the calcaneus with inflammation fluid buildup noted.  Patient is found to have good digital perfusion well oriented x3     Assessment:  Acute plantar fasciitis left with inflammation fluid of the medial band     Plan:  H&P condition reviewed x-ray reviewed.  Today I did sterile prep I injected the plantar fascia at insertion 3 mg Kenalog 5 mg Xylocaine and applied fascial brace to lift up the arch.  Gave instructions for physical therapy shoe gear modifications and placed on diclofenac 75 mg twice daily.  Reappoint to recheck  X-rays indicate there is spur formation no indications of stress fracture arthritis

## 2020-05-12 ENCOUNTER — Encounter: Payer: Self-pay | Admitting: Podiatry

## 2020-05-12 ENCOUNTER — Ambulatory Visit (INDEPENDENT_AMBULATORY_CARE_PROVIDER_SITE_OTHER): Payer: 59 | Admitting: Podiatry

## 2020-05-12 ENCOUNTER — Other Ambulatory Visit: Payer: Self-pay

## 2020-05-12 VITALS — Temp 97.3°F

## 2020-05-12 DIAGNOSIS — M722 Plantar fascial fibromatosis: Secondary | ICD-10-CM | POA: Diagnosis not present

## 2020-05-13 NOTE — Progress Notes (Signed)
Subjective:   Patient ID: Melody Randolph, female   DOB: 59 y.o.   MRN: [id number]   HPI Patient presents stating that the left foot seems to be feeling quite a bit better but she still does not feel like she is walking normally and she does also have tremendous dry skin formation   ROS      Objective:  Physical Exam  Neurovascular status intact with patient found to have continued discomfort plantar aspect left but improved from previous with very dry skin that is been present for a long time along with diminished fat pad left and right heel arch     Assessment:  Fasciitis left with diminished fat pad dry skin which could be part of the issue that she is experiencing     Plan:  H&P condition reviewed and I went ahead and I discussed that she does need to walk on her entire heel.  At this point I did go ahead and I advised her on orthotics to try to reduce the plantar pressures on her feet and provide for a deeper heel pad.  Patient wants this and is casted for functional orthotic devices which will be dispensed by ped orthotist and shoe gear modifications to be discussed at that time

## 2020-06-02 ENCOUNTER — Other Ambulatory Visit: Payer: 59 | Admitting: Orthotics

## 2020-06-02 DIAGNOSIS — F1020 Alcohol dependence, uncomplicated: Secondary | ICD-10-CM | POA: Diagnosis not present

## 2020-06-14 ENCOUNTER — Encounter: Payer: 59 | Admitting: Orthotics

## 2020-06-17 ENCOUNTER — Ambulatory Visit: Payer: 59 | Admitting: Orthotics

## 2020-06-17 ENCOUNTER — Other Ambulatory Visit: Payer: Self-pay

## 2020-06-17 DIAGNOSIS — M722 Plantar fascial fibromatosis: Secondary | ICD-10-CM

## 2020-06-17 DIAGNOSIS — M779 Enthesopathy, unspecified: Secondary | ICD-10-CM

## 2020-06-17 NOTE — Progress Notes (Signed)
Patient came in today to pick up custom made foot orthotics.  The goals were accomplished and the patient reported no dissatisfaction with said orthotics.  Patient was advised of breakin period and how to report any issues. 

## 2020-09-21 DIAGNOSIS — F1020 Alcohol dependence, uncomplicated: Secondary | ICD-10-CM | POA: Diagnosis not present

## 2020-11-04 ENCOUNTER — Ambulatory Visit (INDEPENDENT_AMBULATORY_CARE_PROVIDER_SITE_OTHER): Payer: 59 | Admitting: Podiatry

## 2020-11-04 ENCOUNTER — Other Ambulatory Visit: Payer: Self-pay | Admitting: Podiatry

## 2020-11-04 ENCOUNTER — Encounter: Payer: Self-pay | Admitting: Podiatry

## 2020-11-04 ENCOUNTER — Other Ambulatory Visit: Payer: Self-pay

## 2020-11-04 DIAGNOSIS — M76812 Anterior tibial syndrome, left leg: Secondary | ICD-10-CM

## 2020-11-04 DIAGNOSIS — M722 Plantar fascial fibromatosis: Secondary | ICD-10-CM | POA: Diagnosis not present

## 2020-11-04 MED ORDER — METHYLPREDNISOLONE 4 MG PO TBPK: ORAL_TABLET | ORAL | 0 refills | Status: DC

## 2020-11-04 MED FILL — METHYLPREDNISOLONE 4 MG TBP: 4 | 6 days supply | Qty: 21 | Fill #0

## 2020-11-11 ENCOUNTER — Other Ambulatory Visit (HOSPITAL_COMMUNITY): Payer: Self-pay | Admitting: Family Medicine

## 2020-11-11 DIAGNOSIS — I8310 Varicose veins of unspecified lower extremity with inflammation: Secondary | ICD-10-CM | POA: Diagnosis not present

## 2020-11-11 DIAGNOSIS — R600 Localized edema: Secondary | ICD-10-CM | POA: Diagnosis not present

## 2020-11-12 MED FILL — TRIAMCINOLONE 0.5% OINTMENT: 0.5 | 30 days supply | Qty: 45 | Fill #0

## 2020-11-25 ENCOUNTER — Ambulatory Visit (INDEPENDENT_AMBULATORY_CARE_PROVIDER_SITE_OTHER): Payer: 59 | Admitting: Podiatry

## 2020-11-25 ENCOUNTER — Other Ambulatory Visit: Payer: Self-pay

## 2020-11-25 ENCOUNTER — Encounter: Payer: Self-pay | Admitting: Podiatry

## 2020-11-25 DIAGNOSIS — M722 Plantar fascial fibromatosis: Secondary | ICD-10-CM

## 2020-11-28 NOTE — Progress Notes (Signed)
Subjective:   Patient ID: Melody Randolph, female   DOB: 60 y.o.   MRN: [id number]   HPI Patient presents stating doing quite a bit better still having pain if I do too much   ROS      Objective:  Physical Exam  Neurovascular status intact with discomfort plantar heel left that continues to improve with only mild pain upon deep palpation     Assessment:  Plantar fasciitis left currently improving     Plan:  H&P reviewed condition recommended the continuation of physical therapy anti-inflammatory shoe gear modification and patient will be seen back as needed

## 2021-01-12 DIAGNOSIS — L570 Actinic keratosis: Secondary | ICD-10-CM | POA: Diagnosis not present

## 2021-01-12 DIAGNOSIS — Z1231 Encounter for screening mammogram for malignant neoplasm of breast: Secondary | ICD-10-CM | POA: Diagnosis not present

## 2021-01-12 DIAGNOSIS — Z803 Family history of malignant neoplasm of breast: Secondary | ICD-10-CM | POA: Diagnosis not present

## 2021-01-12 DIAGNOSIS — I8310 Varicose veins of unspecified lower extremity with inflammation: Secondary | ICD-10-CM | POA: Diagnosis not present

## 2021-01-12 DIAGNOSIS — Z87891 Personal history of nicotine dependence: Secondary | ICD-10-CM | POA: Diagnosis not present

## 2021-01-12 DIAGNOSIS — Z859 Personal history of malignant neoplasm, unspecified: Secondary | ICD-10-CM | POA: Diagnosis not present

## 2021-01-12 DIAGNOSIS — L578 Other skin changes due to chronic exposure to nonionizing radiation: Secondary | ICD-10-CM | POA: Diagnosis not present

## 2021-06-15 DIAGNOSIS — I10 Essential (primary) hypertension: Secondary | ICD-10-CM | POA: Diagnosis not present

## 2021-06-15 DIAGNOSIS — Z0189 Encounter for other specified special examinations: Secondary | ICD-10-CM | POA: Diagnosis not present

## 2021-06-15 DIAGNOSIS — R059 Cough, unspecified: Secondary | ICD-10-CM | POA: Diagnosis not present

## 2021-08-11 DIAGNOSIS — F1020 Alcohol dependence, uncomplicated: Secondary | ICD-10-CM | POA: Diagnosis not present

## 2021-09-20 DIAGNOSIS — F1090 Alcohol use, unspecified, uncomplicated: Secondary | ICD-10-CM | POA: Diagnosis not present

## 2021-09-20 DIAGNOSIS — I10 Essential (primary) hypertension: Secondary | ICD-10-CM | POA: Diagnosis not present

## 2021-09-20 DIAGNOSIS — R0600 Dyspnea, unspecified: Secondary | ICD-10-CM | POA: Diagnosis not present

## 2021-09-20 DIAGNOSIS — E669 Obesity, unspecified: Secondary | ICD-10-CM | POA: Diagnosis not present

## 2021-11-02 DIAGNOSIS — R0602 Shortness of breath: Secondary | ICD-10-CM | POA: Diagnosis not present

## 2022-02-15 DIAGNOSIS — L821 Other seborrheic keratosis: Secondary | ICD-10-CM | POA: Diagnosis not present

## 2022-02-15 DIAGNOSIS — L578 Other skin changes due to chronic exposure to nonionizing radiation: Secondary | ICD-10-CM | POA: Diagnosis not present

## 2022-03-20 DIAGNOSIS — F1020 Alcohol dependence, uncomplicated: Secondary | ICD-10-CM | POA: Diagnosis not present

## 2022-03-20 DIAGNOSIS — Z6839 Body mass index (BMI) 39.0-39.9, adult: Secondary | ICD-10-CM | POA: Diagnosis not present

## 2022-03-20 DIAGNOSIS — Z0189 Encounter for other specified special examinations: Secondary | ICD-10-CM | POA: Diagnosis not present

## 2022-03-20 DIAGNOSIS — E669 Obesity, unspecified: Secondary | ICD-10-CM | POA: Diagnosis not present

## 2022-03-21 DIAGNOSIS — Z0189 Encounter for other specified special examinations: Secondary | ICD-10-CM | POA: Diagnosis not present

## 2022-10-05 DIAGNOSIS — I255 Ischemic cardiomyopathy: Secondary | ICD-10-CM | POA: Diagnosis not present

## 2022-12-25 DIAGNOSIS — Z1231 Encounter for screening mammogram for malignant neoplasm of breast: Secondary | ICD-10-CM | POA: Diagnosis not present

## 2022-12-26 DIAGNOSIS — I10 Essential (primary) hypertension: Secondary | ICD-10-CM | POA: Diagnosis not present

## 2022-12-26 DIAGNOSIS — F39 Unspecified mood [affective] disorder: Secondary | ICD-10-CM | POA: Diagnosis not present

## 2022-12-26 DIAGNOSIS — M47816 Spondylosis without myelopathy or radiculopathy, lumbar region: Secondary | ICD-10-CM | POA: Diagnosis not present

## 2022-12-26 DIAGNOSIS — L309 Dermatitis, unspecified: Secondary | ICD-10-CM | POA: Diagnosis not present

## 2023-01-11 ENCOUNTER — Encounter: Payer: Self-pay | Admitting: Orthopedic Surgery

## 2023-01-11 ENCOUNTER — Ambulatory Visit: Payer: No Typology Code available for payment source | Admitting: Orthopedic Surgery

## 2023-01-11 ENCOUNTER — Other Ambulatory Visit: Payer: Self-pay

## 2023-01-11 VITALS — BP 123/81 | HR 78 | Ht 64.0 in

## 2023-01-11 DIAGNOSIS — M545 Low back pain, unspecified: Secondary | ICD-10-CM

## 2023-01-11 NOTE — Progress Notes (Signed)
Orthopedic Spine Surgery Office Note  Assessment: Patient is a 62 y.o. female with left lateral thigh and leg pain.  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   ___________________________________________________________________________   History:  Patient is a 62 y.o. female who presents today for lumbar spine.  Patient has had several years of left sided thigh and leg pain.  She feels it on the lateral aspect of the thigh and leg.  Pain is felt on a near daily basis.  She notices it particularly when she sleeps on the right side at night.  She also notices it if she is active.  She does not have any pain radiating on the right side.  Denies paresthesias and numbness.   Weakness: Denies Symptoms of imbalance: Denies Paresthesias and numbness: Denies Bowel or bladder incontinence: Denies Saddle anesthesia: Denies  Treatments tried: Activity modification, PT, Tylenol  Review of systems: Denies fevers and chills, night sweats, unexplained weight loss, history of cancer. Has had pain that wakes her at night  Past medical history: Hypertension Depression/anxiety Osteoarthritis  Allergies: NKDA  Past surgical history:  Ovarian cyst surgery  Social history: Denies use of nicotine product (smoking, vaping, patches, smokeless) Alcohol use: Yes, approximately 12 drinks per week Denies  recreational drug use   Physical Exam:  General: no acute distress, appears stated age Neurologic: alert, answering questions appropriately, following commands Respiratory: unlabored breathing on room air, symmetric chest rise Psychiatric: appropriate affect, normal cadence to speech   MSK (spine):  -Strength exam      Left  Right EHL    5/5  5/5 TA    5/5  5/5 GSC    5/5  5/5 Knee extension  5/5  5/5 Hip flexion   5/5  5/5  -Sensory exam    Sensation intact to light touch in L3-S1 nerve distributions of bilateral lower extremities  -Achilles DTR: 2/4 on the left, 2/4 on the right -Patellar tendon DTR: 2/4 on the left, 2/4 on the right  -Straight leg raise: Negative bilaterally -Femoral nerve stretch test: Negative bilaterally -Clonus: no beats bilaterally  -Left hip exam: No pain through range of motion, negative Stinchfield, negative FABER, negative SI joint compression test -Right hip exam: No pain through range of motion, negative Stinchfield, negative FABER, negative SI joint compression test  Imaging: XR of the lumbar spine from 01/11/2023 was independently reviewed and interpreted, showing no evidence of instability on flexion/extension views.  No fracture or dislocation seen.  No significant degenerative changes.  MRI of the lumbar spine from 12/22/2022 was independently reviewed and interpreted, showing lateral recess stenosis at L4/5.  No other significant stenosis seen.   Patient name: Melody Randolph Patient MRN: [id number] Date of visit: 01/11/23

## 2023-01-14 NOTE — Progress Notes (Signed)
  Cardiology Office Note:   Date:  01/22/2023  ID:  Melody Randolph, DOB May 19, 1961, MRN [id number]  History of Present Illness:   Melody Randolph is a 62 y.o. female HTN, HLD, and anxiety who was referred by the [facility] for further evaluation of chest pain.  Today, the patient states she has been having more progressive dyspnea on exertion. Symptoms are worse with inclines and stairs. Has been ongoing for a couple of years. States that symptoms began after she was very sick with what she thinks was COVID at the time. No associated chest pressure. Has chronic mild LE edema. No orthopnea or PND.   Admits that she drinks about 1 bottle of wine per week and 12 pack of beer per week.   Notably, myoview done at the [place] with small area of diminished uptake at the anerolateral wall and at apex that was reversible.  Family History: sister with LVAD (viral myocarditis), dad with CHF (22s)  Past Medical History:  Diagnosis Date   Anxiety    Arthritis    Back pain    Bruises easily    Depression    Difficulty walking    Fatigue    HBP (high blood pressure)    Joint pain    Neck pain    Nervous    Sweating    Tremors of nervous system      ROS: AS per HPI  Studies Reviewed:    EKG:  NSR, HR 75-personally reviewed       Risk Assessment/Calculations:              Physical Exam:   VS:  BP 136/82   Pulse 75   Ht 5\' 4"  (1.626 m)   Wt 228 lb (103.4 kg)   SpO2 98%   BMI 39.14 kg/m    Wt Readings from Last 3 Encounters:  01/22/23 228 lb (103.4 kg)  08/01/13 210 lb (95.3 kg)     GEN: Well nourished, well developed in no acute distress NECK: No JVD; No carotid bruits CARDIAC: RRR, no murmurs, rubs, gallops RESPIRATORY:  Clear to auscultation without rales, wheezing or rhonchi  ABDOMEN: Soft, non-tender, non-distended EXTREMITIES:  Trace edema, warm   ASSESSMENT AND PLAN:   #DOE: #Positive Stress Test: -Patient with progressive DOE over the past several years -Was found to have  mild, small anterolateral ischemia on myoview at [place] -Given symptoms and positive stress test, will check coronary CTA and TTE for further evaluation  #HTN: -Continue amlodipine 2.5mg  daily -Well controlled and at goal <130/90      Signed, Meriam Sprague, MD

## 2023-01-18 ENCOUNTER — Ambulatory Visit (HOSPITAL_BASED_OUTPATIENT_CLINIC_OR_DEPARTMENT_OTHER): Payer: Commercial Managed Care - PPO | Admitting: Cardiology

## 2023-01-22 ENCOUNTER — Encounter: Payer: Self-pay | Admitting: Cardiology

## 2023-01-22 ENCOUNTER — Ambulatory Visit: Payer: No Typology Code available for payment source | Attending: Cardiology | Admitting: Cardiology

## 2023-01-22 ENCOUNTER — Telehealth: Payer: Self-pay | Admitting: *Deleted

## 2023-01-22 ENCOUNTER — Other Ambulatory Visit (HOSPITAL_COMMUNITY): Payer: Self-pay

## 2023-01-22 VITALS — BP 136/82 | HR 75 | Ht 64.0 in | Wt 228.0 lb

## 2023-01-22 DIAGNOSIS — R072 Precordial pain: Secondary | ICD-10-CM

## 2023-01-22 DIAGNOSIS — I10 Essential (primary) hypertension: Secondary | ICD-10-CM

## 2023-01-22 DIAGNOSIS — R0609 Other forms of dyspnea: Secondary | ICD-10-CM

## 2023-01-22 MED ORDER — METOPROLOL TARTRATE 100 MG PO TABS
100.0000 mg | ORAL_TABLET | Freq: Once | ORAL | 0 refills | Status: AC
  Filled 2023-01-22: qty 1, 1d supply, fill #0

## 2023-01-22 NOTE — Telephone Encounter (Signed)
-----   Message from Lorrin Jackson sent at 01/22/2023  4:51 PM EDT ----- Regarding: ct scan Scheduled 01/31/23

## 2023-01-22 NOTE — Patient Instructions (Signed)
Medication Instructions:   Your physician recommends that you continue on your current medications as directed. Please refer to the Current Medication list given to you today.  *If you need a refill on your cardiac medications before your next appointment, please call your pharmacy*   Testing/Procedures:  Your physician has requested that you have an echocardiogram. Echocardiography is a painless test that uses sound waves to create images of your heart. It provides your doctor with information about the size and shape of your heart and how well your heart's chambers and valves are working. This procedure takes approximately one hour. There are no restrictions for this procedure. Please do NOT wear cologne, perfume, aftershave, or lotions (deodorant is allowed). Please arrive 15 minutes prior to your appointment time.     Your cardiac CT will be scheduled at one of the below locations:   [facility] [address] [place], [place] [address] [phone]   If scheduled at [facility], please arrive at the [facility] and Children's Entrance (Entrance C2) of [facility] 30 minutes prior to test start time. You can use the FREE valet parking offered at entrance C (encouraged to control the heart rate for the test)  Proceed to the [facility] Radiology Department (first floor) to check-in and test prep.  All radiology patients and guests should use entrance C2 at [facility], accessed from [facility], even though the hospital's physical address listed is [address]       Please follow these instructions carefully (unless otherwise directed):   On the Night Before the Test: Be sure to Drink plenty of water. Do not consume any caffeinated/decaffeinated beverages or chocolate 12 hours prior to your test. Do not take any antihistamines 12 hours prior to your test.  HOLD CETRIZINE 12 HOURS PRIOR TO TEST   On the Day of the  Test: Drink plenty of water until 1 hour prior to the test. Do not eat any food 1 hour prior to test. You may take your regular medications prior to the test.  Take metoprolol 100 MG BY MOUTH (Lopressor) two hours prior to test.  FEMALES- please wear underwire-free bra if available, avoid dresses & tight clothing        After the Test: Drink plenty of water. After receiving IV contrast, you may experience a mild flushed feeling. This is normal. On occasion, you may experience a mild rash up to 24 hours after the test. This is not dangerous. If this occurs, you can take Benadryl 25 mg and increase your fluid intake. If you experience trouble breathing, this can be serious. If it is severe call 911 IMMEDIATELY. If it is mild, please call our office.   We will call to schedule your test 2-4 weeks out understanding that some insurance companies will need an authorization prior to the service being performed.   For non-scheduling related questions, please contact the cardiac imaging nurse navigator should you have any questions/concerns: Rockwell Alexandria, Cardiac Imaging Nurse Navigator Larey Brick, Cardiac Imaging Nurse Navigator  [facility] Services Direct Office Dial: [phone]   For scheduling needs, including cancellations and rescheduling, please call [place], [phone].    Follow-Up: At [facility], you and your health needs are our priority.  As part of our continuing mission to provide you with exceptional heart care, we have created designated Provider Care Teams.  These Care Teams include your primary Cardiologist (physician) and Advanced Practice Providers (APPs -  Physician Assistants and Nurse Practitioners) who all work together to provide you with the care you need, when you need it.  We recommend signing up for the patient portal called "MyChart".  Sign up information is provided on this After Visit Summary.  MyChart is used to connect  with patients for Virtual Visits (Telemedicine).  Patients are able to view lab/test results, encounter notes, upcoming appointments, etc.  Non-urgent messages can be sent to your provider as well.   To learn more about what you can do with MyChart, go to [email or web address].    Your next appointment:   6 month(s)  Provider:   Dr. Shari Prows

## 2023-01-23 ENCOUNTER — Other Ambulatory Visit (HOSPITAL_COMMUNITY): Payer: Self-pay

## 2023-01-26 DIAGNOSIS — H5203 Hypermetropia, bilateral: Secondary | ICD-10-CM | POA: Diagnosis not present

## 2023-01-30 ENCOUNTER — Telehealth (HOSPITAL_COMMUNITY): Payer: Self-pay | Admitting: *Deleted

## 2023-01-30 NOTE — Telephone Encounter (Signed)
Patient returning call about her upcoming cardiac imaging study; pt verbalizes understanding of appt date/time, parking situation and where to check in, pre-test NPO status and medications ordered, and verified current allergies; name and call back number provided for further questions should they arise  Larey Brick RN Navigator Cardiac Imaging [facility] [phone] office [phone] cell  Patient to take 100mg  metoprolol tartrate two hours prior to her cardiac CT scan. She is aware to arrive at 8:30am.

## 2023-01-30 NOTE — Telephone Encounter (Signed)
Attempted to call patient regarding upcoming cardiac CT appointment. °Left message on voicemail with name and callback number ° °Emma-Lee Oddo RN Navigator Cardiac Imaging °[facility] °[phone] Office °[phone] Cell ° °

## 2023-01-31 ENCOUNTER — Telehealth: Payer: Self-pay | Admitting: *Deleted

## 2023-01-31 ENCOUNTER — Other Ambulatory Visit (HOSPITAL_COMMUNITY): Payer: Self-pay

## 2023-01-31 ENCOUNTER — Ambulatory Visit (HOSPITAL_COMMUNITY)
Admission: RE | Admit: 2023-01-31 | Discharge: 2023-01-31 | Disposition: A | Discharge status: Home / Self Care | Payer: No Typology Code available for payment source | Source: Ambulatory Visit | Attending: Cardiology | Admitting: Cardiology

## 2023-01-31 DIAGNOSIS — I10 Essential (primary) hypertension: Secondary | ICD-10-CM | POA: Insufficient documentation

## 2023-01-31 DIAGNOSIS — R0609 Other forms of dyspnea: Secondary | ICD-10-CM | POA: Insufficient documentation

## 2023-01-31 DIAGNOSIS — R072 Precordial pain: Secondary | ICD-10-CM | POA: Insufficient documentation

## 2023-01-31 DIAGNOSIS — E785 Hyperlipidemia, unspecified: Secondary | ICD-10-CM

## 2023-01-31 DIAGNOSIS — I2510 Atherosclerotic heart disease of native coronary artery without angina pectoris: Secondary | ICD-10-CM

## 2023-01-31 DIAGNOSIS — Z79899 Other long term (current) drug therapy: Secondary | ICD-10-CM

## 2023-01-31 MED ORDER — IOHEXOL 350 MG/ML SOLN
100.0000 mL | Freq: Once | INTRAVENOUS | Status: AC | PRN
  Administered 2023-01-31: 100 mL via INTRAVENOUS

## 2023-01-31 MED ORDER — ROSUVASTATIN CALCIUM 10 MG PO TABS
10.0000 mg | ORAL_TABLET | Freq: Every day | ORAL | 2 refills | Status: AC
Start: 2023-01-31 — End: ?
  Filled 2023-01-31: qty 90, 90d supply, fill #0
  Filled 2023-05-21: qty 90, 90d supply, fill #1
  Filled 2023-09-19: qty 90, 90d supply, fill #2

## 2023-01-31 MED ORDER — NITROGLYCERIN 0.4 MG SL SUBL
SUBLINGUAL_TABLET | SUBLINGUAL | Status: AC
  Filled 2023-01-31: qty 2

## 2023-01-31 MED ORDER — NITROGLYCERIN 0.4 MG SL SUBL
0.8000 mg | SUBLINGUAL_TABLET | Freq: Once | SUBLINGUAL | Status: AC
  Administered 2023-01-31: 0.8 mg via SUBLINGUAL

## 2023-01-31 NOTE — Telephone Encounter (Signed)
The patient has been notified of the result and verbalized understanding.  All questions (if any) were answered.  Pt aware to start taking crestor 10 mg po daily and come in for repeat lipids in 8 weeks to reassess.   Confirmed the pharmacy of choice with the pt.  Scheduled the pt for repeat lipids in 8 weeks on 03/28/23.  She is aware to come fasting to this lab appointment.  Pt verbalized understanding and agrees with this plan.

## 2023-01-31 NOTE — Telephone Encounter (Signed)
-----   Message from Meriam Sprague, MD sent at 01/31/2023  2:21 PM EDT ----- Her coronary CTA looks great with minimal disease but no significant blockages. This is great news and means her breathing is not related to her heart arteries. She can exercise without restriction.   Given that she has mild plaque, would recommend starting crestor 10mg  daily and repeating lipids in 8 weeks to drive ZOX<09

## 2023-02-02 ENCOUNTER — Other Ambulatory Visit (HOSPITAL_COMMUNITY): Payer: Self-pay

## 2023-02-21 ENCOUNTER — Ambulatory Visit (HOSPITAL_COMMUNITY): Payer: No Typology Code available for payment source | Attending: Cardiology

## 2023-02-21 DIAGNOSIS — R072 Precordial pain: Secondary | ICD-10-CM | POA: Diagnosis not present

## 2023-02-21 DIAGNOSIS — R0609 Other forms of dyspnea: Secondary | ICD-10-CM | POA: Insufficient documentation

## 2023-02-21 DIAGNOSIS — I10 Essential (primary) hypertension: Secondary | ICD-10-CM | POA: Insufficient documentation

## 2023-02-21 LAB — ECHOCARDIOGRAM COMPLETE
Area-P 1/2: 3.37 cm2
P 1/2 time: 759 msec
S' Lateral: 2.9 cm

## 2023-03-28 ENCOUNTER — Ambulatory Visit: Payer: No Typology Code available for payment source | Attending: Cardiology

## 2023-03-28 DIAGNOSIS — Z79899 Other long term (current) drug therapy: Secondary | ICD-10-CM

## 2023-03-28 DIAGNOSIS — I2510 Atherosclerotic heart disease of native coronary artery without angina pectoris: Secondary | ICD-10-CM

## 2023-03-28 DIAGNOSIS — E785 Hyperlipidemia, unspecified: Secondary | ICD-10-CM

## 2023-03-29 LAB — LIPID PANEL
Chol/HDL Ratio: 2.4 ratio (ref 0.0–4.4)
Cholesterol, Total: 143 mg/dL (ref 100–199)
HDL: 60 mg/dL (ref >39)
LDL Chol Calc (NIH): 72 mg/dL (ref 0–99)
Triglycerides: 49 mg/dL (ref 0–149)
VLDL Cholesterol Cal: 11 mg/dL (ref 5–40)

## 2023-05-21 ENCOUNTER — Other Ambulatory Visit: Payer: Self-pay | Admitting: Cardiology

## 2023-05-21 DIAGNOSIS — R072 Precordial pain: Secondary | ICD-10-CM

## 2023-05-21 DIAGNOSIS — R0609 Other forms of dyspnea: Secondary | ICD-10-CM

## 2023-05-21 DIAGNOSIS — I10 Essential (primary) hypertension: Secondary | ICD-10-CM

## 2023-05-22 ENCOUNTER — Other Ambulatory Visit (HOSPITAL_COMMUNITY): Payer: Self-pay

## 2023-07-13 ENCOUNTER — Ambulatory Visit: Payer: No Typology Code available for payment source | Admitting: Cardiology

## 2023-09-20 ENCOUNTER — Other Ambulatory Visit (HOSPITAL_COMMUNITY): Payer: Self-pay

## 2024-03-19 ENCOUNTER — Other Ambulatory Visit (HOSPITAL_COMMUNITY): Payer: Self-pay
